# Patient Record
Sex: Female | Born: 1995 | Race: Black or African American | Hispanic: No | Marital: Single | Smoking: Never smoker
Health system: Southern US, Community
[De-identification: ages and names within clinical notes are randomized; demographics above are authoritative.]

---

## 2019-01-15 ENCOUNTER — Emergency Department (HOSPITAL_COMMUNITY)
Admission: EM | Admit: 2019-01-15 | Discharge: 2019-01-16 | Disposition: A | Discharge status: Home / Self Care | Payer: Federal, State, Local not specified - PPO | Attending: Emergency Medicine | Admitting: Emergency Medicine

## 2019-01-15 ENCOUNTER — Encounter (HOSPITAL_COMMUNITY): Payer: Self-pay

## 2019-01-15 ENCOUNTER — Other Ambulatory Visit: Payer: Self-pay

## 2019-01-15 DIAGNOSIS — M7918 Myalgia, other site: Secondary | ICD-10-CM | POA: Diagnosis not present

## 2019-01-15 DIAGNOSIS — F1210 Cannabis abuse, uncomplicated: Secondary | ICD-10-CM | POA: Diagnosis not present

## 2019-01-15 DIAGNOSIS — J111 Influenza due to unidentified influenza virus with other respiratory manifestations: Secondary | ICD-10-CM | POA: Diagnosis not present

## 2019-01-15 DIAGNOSIS — R0981 Nasal congestion: Secondary | ICD-10-CM | POA: Diagnosis not present

## 2019-01-15 DIAGNOSIS — R05 Cough: Secondary | ICD-10-CM | POA: Diagnosis present

## 2019-01-15 NOTE — ED Triage Notes (Signed)
Pt arrives POV for eval of cough, general malaise, body aches and congestion since Thursday. Pt reports +sick contacts.

## 2019-01-16 ENCOUNTER — Emergency Department (HOSPITAL_COMMUNITY): Payer: Federal, State, Local not specified - PPO

## 2019-01-16 MED ORDER — IBUPROFEN 800 MG PO TABS: 800.0000 mg | ORAL_TABLET | Freq: Three times a day (TID) | ORAL | 0 refills | Status: AC | PRN

## 2019-01-16 MED ORDER — PROMETHAZINE-DM 6.25-15 MG/5ML PO SYRP: 5.0000 mL | ORAL_SOLUTION | Freq: Four times a day (QID) | ORAL | 0 refills | Status: AC | PRN

## 2019-01-16 MED ORDER — GUAIFENESIN ER 1200 MG PO TB12: 1.0000 | ORAL_TABLET | Freq: Two times a day (BID) | ORAL | 0 refills | Status: AC

## 2019-01-16 NOTE — ED Notes (Signed)
Patient verbalizes understanding of discharge instructions. Opportunity for questioning and answers were provided. Armband removed by staff, pt discharged from ED.  

## 2019-01-16 NOTE — Discharge Instructions (Addendum)
Return here as needed.  Increase your fluid intake.  Your chest x-ray did not show any abnormalities at this time.  Tylenol 1000 mg every 4 hours for pain and fever

## 2019-01-16 NOTE — ED Notes (Signed)
Pt transported to xray 

## 2019-01-16 NOTE — ED Provider Notes (Signed)
[facility] EMERGENCY DEPARTMENT Provider Note   CSN: [id number] Arrival date & time: 01/15/19  2016    History   Chief Complaint Chief Complaint  Patient presents with  . Flu Like Symptoms    HPI Tihani Polaski is a 23 y.o. female.     HPI Patient presents to the emergency department with cough, body aches, sore throat, runny nose, chills and nasal congestion over the last 4 days.  Patient states that she and her boyfriend live together and they both have similar symptoms.  Patient states that she did take some over-the-counter NyQuil without significant relief of her symptoms.  The patient denies chest pain, shortness of breath, headache,blurred vision, neck pain, fever, c weakness, numbness, dizziness, anorexia, edema, abdominal pain, nausea, vomiting, diarrhea, rash, back pain, dysuria, hematemesis, bloody stool, near syncope, or syncope. History reviewed. No pertinent past medical history.  There are no active problems to display for this patient.   History reviewed. No pertinent surgical history.   OB History   No obstetric history on file.      Home Medications    Prior to Admission medications   Not on File    Family History History reviewed. No pertinent family history.  Social History Social History   Tobacco Use  . Smoking status: Never Smoker  . Smokeless tobacco: Never Used  Substance Use Topics  . Alcohol use: Not Currently    Frequency: Never  . Drug use: Yes    Types: Marijuana    Comment: QD     Allergies   Patient has no known allergies.   Review of Systems Review of Systems  All other systems negative except as documented in the HPI. All pertinent positives and negatives as reviewed in the HPI. Physical Exam Updated Vital Signs BP 123/75   Pulse 87   Temp 98.5 F (36.9 C) (Oral)   Resp 19   Ht 5\' 9"  (1.753 m)   Wt 63.5 kg   LMP 01/09/2019   SpO2 100%   BMI 20.67 kg/m   Physical Exam Vitals signs  and nursing note reviewed.  Constitutional:      General: She is not in acute distress.    Appearance: She is well-developed.  HENT:     Head: Normocephalic and atraumatic.  Eyes:     Pupils: Pupils are equal, round, and reactive to light.  Neck:     Musculoskeletal: Normal range of motion and neck supple.  Cardiovascular:     Rate and Rhythm: Normal rate and regular rhythm.     Heart sounds: Normal heart sounds. No murmur. No friction rub. No gallop.   Pulmonary:     Effort: Pulmonary effort is normal. No respiratory distress.     Breath sounds: Normal breath sounds. No wheezing.  Abdominal:     General: Bowel sounds are normal. There is no distension.     Palpations: Abdomen is soft.     Tenderness: There is no abdominal tenderness.  Skin:    General: Skin is warm and dry.     Capillary Refill: Capillary refill takes less than 2 seconds.     Findings: No erythema or rash.  Neurological:     Mental Status: She is alert and oriented to person, place, and time.     Motor: No abnormal muscle tone.     Coordination: Coordination normal.  Psychiatric:        Behavior: Behavior normal.      ED Treatments / Results  Labs (all labs ordered are listed, but only abnormal results are displayed) Labs Reviewed - No data to display  EKG None  Radiology Dg Chest 2 View  Result Date: 01/16/2019 CLINICAL DATA:  Chest pain and cough EXAM: CHEST - 2 VIEW COMPARISON:  None. FINDINGS: Lungs are clear. Heart size and pulmonary vascularity are normal. No adenopathy. No pneumothorax. No bone lesions. IMPRESSION: No edema or consolidation. Electronically Signed   By: Bretta Bang III M.D.   On: 01/16/2019 08:01    Procedures Procedures (including critical care time)  Medications Ordered in ED Medications - No data to display   Initial Impression / Assessment and Plan / ED Course  I have reviewed the triage vital signs and the nursing notes.  Pertinent labs & imaging results  that were available during my care of the patient were reviewed by me and considered in my medical decision making (see chart for details).        Patient will be treated for an influenza-like illness.  Told to increase her fluid intake and rest as much as possible.  Patient agrees the plan and all questions were answered.  Patient has been stable here in the emergency department her vital signs remained stable as well.  Final Clinical Impressions(s) / ED Diagnoses   Final diagnoses:  None    ED Discharge Orders    None       Charlestine Night, PA-C 01/16/19 0827    Tegeler, Canary Brim, MD 01/16/19 1125

## 2019-05-04 ENCOUNTER — Other Ambulatory Visit: Payer: Self-pay

## 2019-05-04 ENCOUNTER — Encounter (HOSPITAL_COMMUNITY): Payer: Self-pay

## 2019-05-04 ENCOUNTER — Emergency Department (HOSPITAL_COMMUNITY)
Admission: EM | Admit: 2019-05-04 | Discharge: 2019-05-04 | Disposition: A | Discharge status: Home / Self Care | Payer: Federal, State, Local not specified - PPO | Attending: Emergency Medicine | Admitting: Emergency Medicine

## 2019-05-04 DIAGNOSIS — R42 Dizziness and giddiness: Secondary | ICD-10-CM | POA: Diagnosis not present

## 2019-05-04 DIAGNOSIS — R110 Nausea: Secondary | ICD-10-CM | POA: Insufficient documentation

## 2019-05-04 DIAGNOSIS — E860 Dehydration: Secondary | ICD-10-CM | POA: Diagnosis not present

## 2019-05-04 DIAGNOSIS — R55 Syncope and collapse: Secondary | ICD-10-CM

## 2019-05-04 DIAGNOSIS — F1210 Cannabis abuse, uncomplicated: Secondary | ICD-10-CM | POA: Insufficient documentation

## 2019-05-04 LAB — COMPREHENSIVE METABOLIC PANEL
ALT: 15 U/L (ref 0–44)
AST: 23 U/L (ref 15–41)
Albumin: 4.8 g/dL (ref 3.5–5.0)
Alkaline Phosphatase: 51 U/L (ref 38–126)
Anion gap: 9 (ref 5–15)
BUN: 16 mg/dL (ref 6–20)
CO2: 22 mmol/L (ref 22–32)
Calcium: 9.2 mg/dL (ref 8.9–10.3)
Chloride: 103 mmol/L (ref 98–111)
Creatinine, Ser: 1.01 mg/dL — ABNORMAL HIGH (ref 0.44–1.00)
GFR calc Af Amer: >60 mL/min (ref >60)
GFR calc non Af Amer: >60 mL/min (ref >60)
Glucose, Bld: 75 mg/dL (ref 70–99)
Potassium: 4.1 mmol/L (ref 3.5–5.1)
Sodium: 134 mmol/L — ABNORMAL LOW (ref 135–145)
Total Bilirubin: 0.3 mg/dL (ref 0.3–1.2)
Total Protein: 8.4 g/dL — ABNORMAL HIGH (ref 6.5–8.1)

## 2019-05-04 LAB — CBC
HCT: 41 % (ref 36.0–46.0)
Hemoglobin: 13.4 g/dL (ref 12.0–15.0)
MCH: 28.9 pg (ref 26.0–34.0)
MCHC: 32.7 g/dL (ref 30.0–36.0)
MCV: 88.6 fL (ref 80.0–100.0)
Platelets: 315 10*3/uL (ref 150–400)
RBC: 4.63 MIL/uL (ref 3.87–5.11)
RDW: 13.5 % (ref 11.5–15.5)
WBC: 10.5 10*3/uL (ref 4.0–10.5)
nRBC: 0 % (ref 0.0–0.2)

## 2019-05-04 LAB — URINALYSIS, ROUTINE W REFLEX MICROSCOPIC
Bilirubin Urine: NEGATIVE
Glucose, UA: NEGATIVE mg/dL
Hgb urine dipstick: NEGATIVE
Ketones, ur: NEGATIVE mg/dL
Leukocytes,Ua: NEGATIVE
Nitrite: NEGATIVE
Protein, ur: NEGATIVE mg/dL
Specific Gravity, Urine: 1.016 (ref 1.005–1.030)
pH: 6 (ref 5.0–8.0)

## 2019-05-04 LAB — I-STAT BETA HCG BLOOD, ED (MC, WL, AP ONLY): I-stat hCG, quantitative: <5 m[IU]/mL (ref <5)

## 2019-05-04 LAB — CBG MONITORING, ED: Glucose-Capillary: 85 mg/dL (ref 70–99)

## 2019-05-04 LAB — MAGNESIUM: Magnesium: 2.3 mg/dL (ref 1.7–2.4)

## 2019-05-04 MED ORDER — SODIUM CHLORIDE 0.9% FLUSH: 3.0000 mL | Freq: Once | INTRAVENOUS | Status: DC

## 2019-05-04 NOTE — ED Triage Notes (Signed)
Pt was at work at [employer] yesterday. Pt states the last thing she remembers is unloading a truck, then waking up in the office with an ice pack on her neck .   Pt states nausea today.

## 2019-05-04 NOTE — ED Provider Notes (Signed)
[facility]-EMERGENCY DEPT Provider Note   CSN: [id number] Arrival date & time: 05/04/19  1722    History   Chief Complaint Chief Complaint  Patient presents with  . Loss of Consciousness    HPI Alicia Bowers is a 23 y.o. female.     The history is provided by the patient. No language interpreter was used.  Loss of Consciousness   Alicia Bowers is a 23 y.o. female who presents to the Emergency Department complaining of syncope. She presents to the emergency department for evaluation following a syncopal event that occurred yesterday. She has no past medical history and works for [employer]. She was feeling well until yesterday when she developed nausea. She was unloading a truck began to feel dizzy and lightheaded. Next thing she remembers is waking up in an office with an ice pack on her neck. She does not believe she hit her head but is unsure. She complains of persistent nausea and dizziness described this lightheadedness today. She denies any fevers, chest pain, shortness of breath, abdominal pain, dysuria, diarrhea, hematochezia, melena, vaginal discharge. She did have Charlie horses last night and her thighs. She denies any lower extremity swelling or pain. She does smoke marijuana. Denies alcohol use. No prior similar symptoms. LMP was on May 9 and was normal. She was working in a hot environment yesterday. No known family medical history. History reviewed. No pertinent past medical history.  There are no active problems to display for this patient.   History reviewed. No pertinent surgical history.   OB History   No obstetric history on file.      Home Medications    Prior to Admission medications   Medication Sig Start Date End Date Taking? Authorizing Provider  Guaifenesin 1200 MG TB12 Take 1 tablet (1,200 mg total) by mouth 2 (two) times daily. Patient not taking: Reported on 05/04/2019 01/16/19   Charlestine NightLawyer, Christopher, PA-C  ibuprofen  (ADVIL,MOTRIN) 800 MG tablet Take 1 tablet (800 mg total) by mouth every 8 (eight) hours as needed. Patient not taking: Reported on 05/04/2019 01/16/19   Charlestine NightLawyer, Christopher, PA-C  promethazine-dextromethorphan (PROMETHAZINE-DM) 6.25-15 MG/5ML syrup Take 5-10 mLs by mouth 4 (four) times daily as needed for cough. Patient not taking: Reported on 05/04/2019 01/16/19   Charlestine NightLawyer, Christopher, PA-C    Family History No family history on file.  Social History Social History   Tobacco Use  . Smoking status: Never Smoker  . Smokeless tobacco: Never Used  Substance Use Topics  . Alcohol use: Not Currently    Frequency: Never  . Drug use: Yes    Types: Marijuana    Comment: QD     Allergies   Patient has no known allergies.   Review of Systems Review of Systems  Cardiovascular: Positive for syncope.  All other systems reviewed and are negative.    Physical Exam Updated Vital Signs BP 120/70   Pulse 76   Temp 98.8 F (37.1 C) (Oral)   Resp (!) 21   Ht 5\' 9"  (1.753 m)   Wt 65.8 kg   LMP 04/07/2019   SpO2 100%   BMI 21.41 kg/m   Physical Exam Vitals signs and nursing note reviewed.  Constitutional:      Appearance: She is well-developed.  HENT:     Head: Normocephalic and atraumatic.  Cardiovascular:     Rate and Rhythm: Normal rate and regular rhythm.     Heart sounds: No murmur.  Pulmonary:     Effort: Pulmonary  effort is normal. No respiratory distress.     Breath sounds: Normal breath sounds.  Abdominal:     Palpations: Abdomen is soft.     Tenderness: There is no abdominal tenderness. There is no guarding or rebound.  Musculoskeletal:        General: No swelling or tenderness.  Skin:    General: Skin is warm and dry.     Capillary Refill: Capillary refill takes less than 2 seconds.  Neurological:     Mental Status: She is alert and oriented to person, place, and time.     Comments: MAE symmetrically.  No asymmetry of facial movement.    Psychiatric:         Behavior: Behavior normal.      ED Treatments / Results  Labs (all labs ordered are listed, but only abnormal results are displayed) Labs Reviewed  URINALYSIS, ROUTINE W REFLEX MICROSCOPIC - Abnormal; Notable for the following components:      Result Value   APPearance HAZY (*)    All other components within normal limits  COMPREHENSIVE METABOLIC PANEL - Abnormal; Notable for the following components:   Sodium 134 (*)    Creatinine, Ser 1.01 (*)    Total Protein 8.4 (*)    All other components within normal limits  CBC  MAGNESIUM  CBG MONITORING, ED  I-STAT BETA HCG BLOOD, ED ([facility], [facility], [facility] ONLY)    EKG EKG Interpretation  Date/Time:  Friday May 04 2019 17:37:29 EDT Ventricular Rate:  106 PR Interval:    QRS Duration: 76 QT Interval:  337 QTC Calculation: 448 R Axis:   90 Text Interpretation:  Sinus tachycardia Borderline right axis deviation Confirmed by Tilden Fossa [phone]) on 05/04/2019 5:43:04 PM   Radiology No results found.  Procedures Procedures (including critical care time)  Medications Ordered in ED Medications - No data to display   Initial Impression / Assessment and Plan / ED Course  I have reviewed the triage vital signs and the nursing notes.  Pertinent labs & imaging results that were available during my care of the patient were reviewed by me and considered in my medical decision making (see chart for details).        Patient here for evaluation of nausea, lightheadedness and syncopal event yesterday. She is non-toxic appearing on evaluation and in no acute distress. Labs are significant for mild hyponatremia, mild elevation and creatinine. Presentation is not consistent with arrhythmia, PE, seizure. HCG is negative, no concern for ectopic. Offered patient IV fluid hydration and she declines. Discussed home care for syncope, dehydration. Discussed oral rehydration. Discussed outpatient follow-up and return precautions.  Final Clinical  Impressions(s) / ED Diagnoses   Final diagnoses:  Syncope and collapse  Dehydration    ED Discharge Orders    None       Tilden Fossa, MD 05/04/19 2329

## 2020-01-06 IMAGING — CR DG CHEST 2V
2 series · 2 of 2 positions shown · non-contrast
Comparison: None.

CLINICAL DATA: Chest pain and cough

EXAM:
CHEST - 2 VIEW

[chest pa]
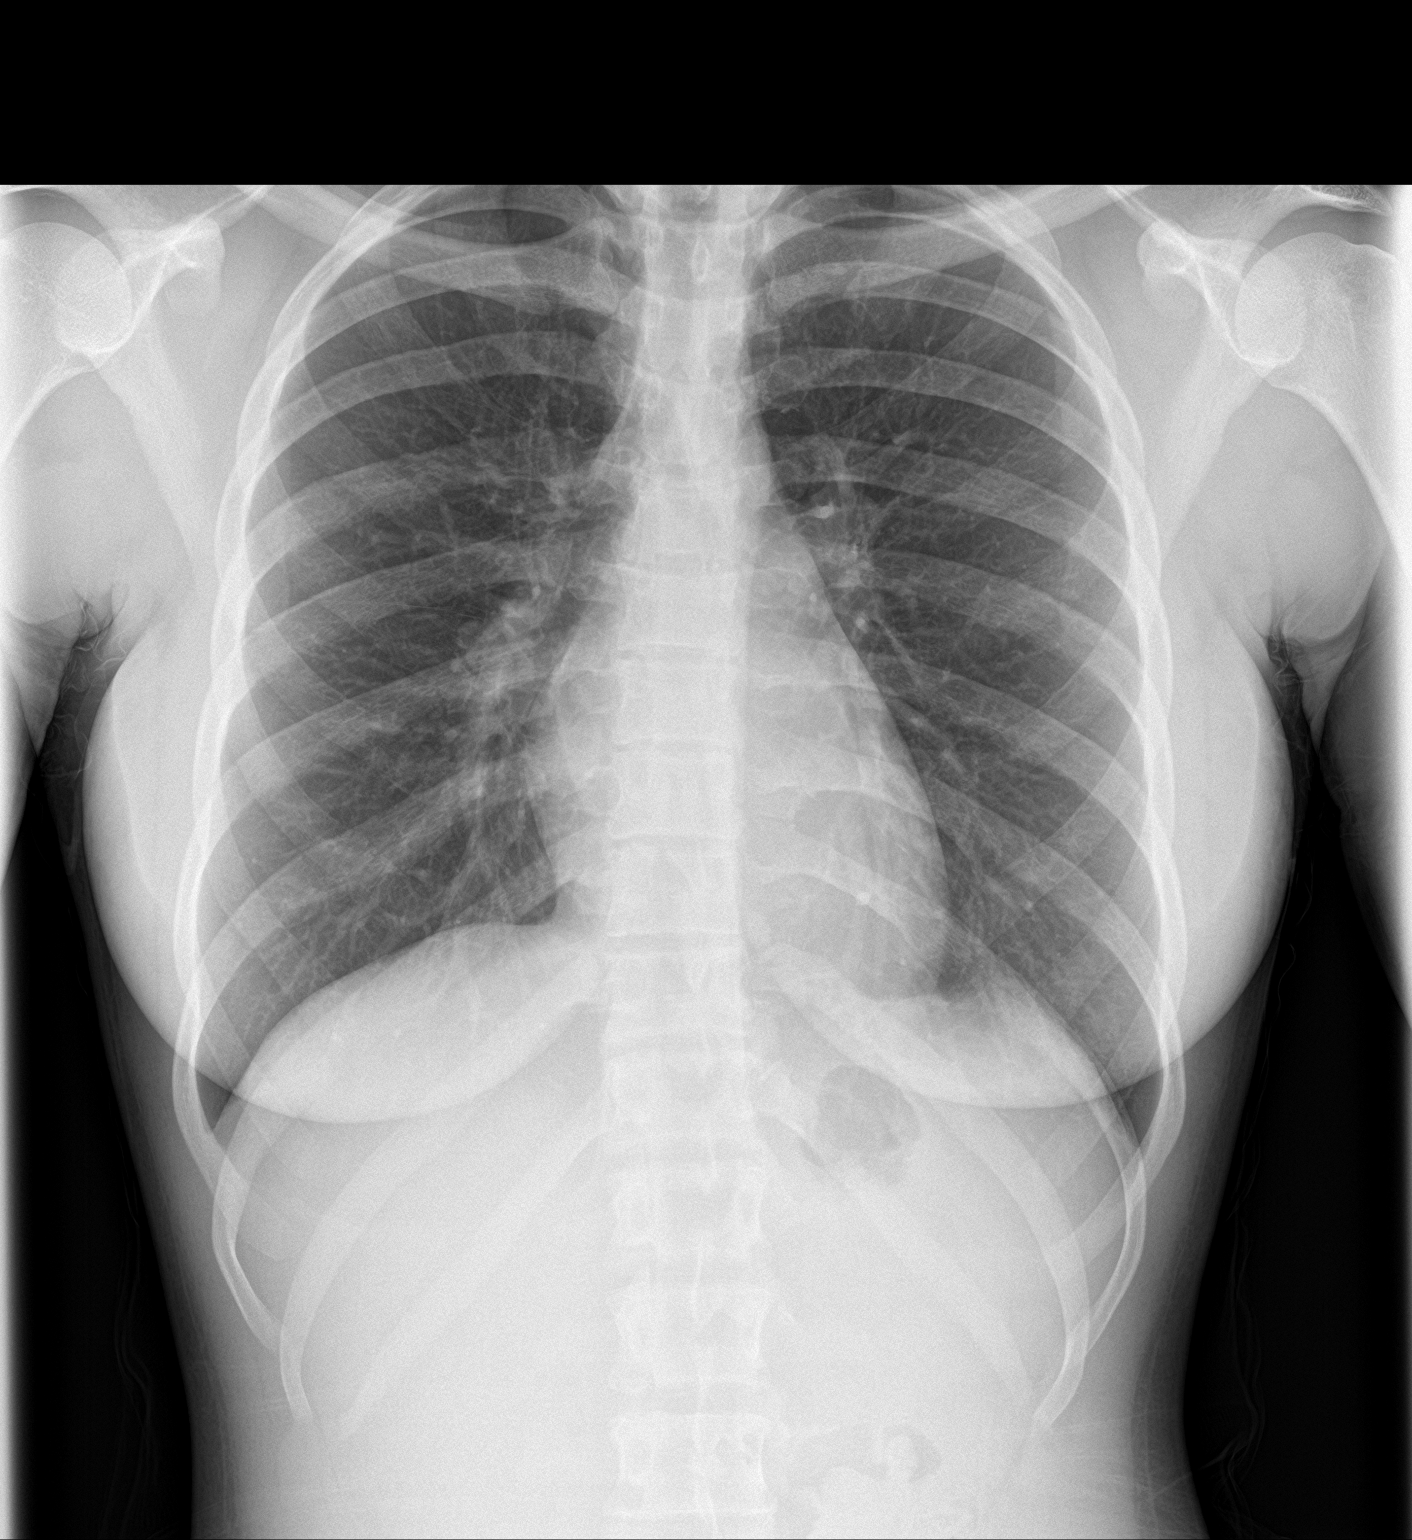

[chest lat]
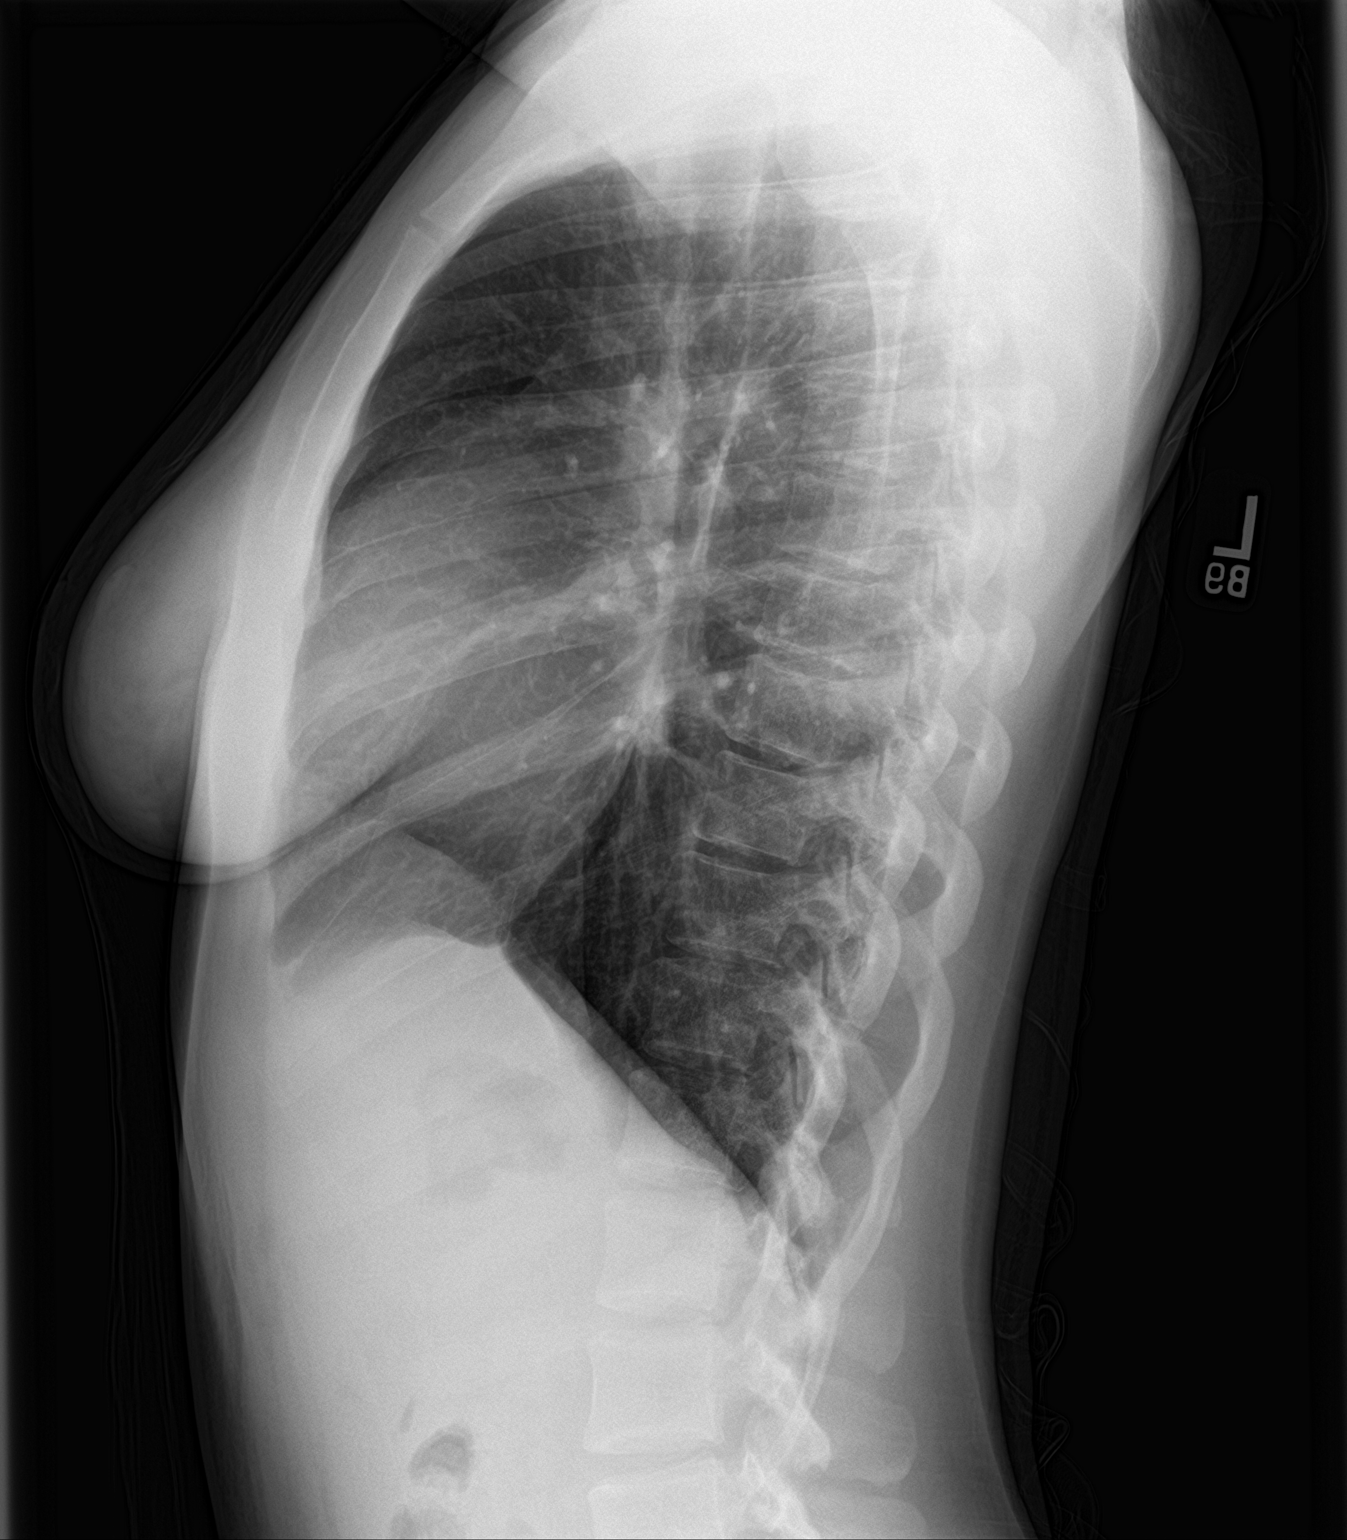

[2 of 2 positions shown; findings below may reference images not displayed]

FINDINGS: Lungs are clear. Heart size and pulmonary vascularity are normal. No
adenopathy. No pneumothorax. No bone lesions.
IMPRESSION: No edema or consolidation.
